# Patient Record
Sex: Female | Born: 1965 | Marital: Married | Smoking: Never smoker
Health system: Southern US, Community
[De-identification: ages and names within clinical notes are randomized; demographics above are authoritative.]

---

## 2012-06-08 ENCOUNTER — Ambulatory Visit: Payer: Self-pay | Admitting: Internal Medicine

## 2019-09-15 ENCOUNTER — Other Ambulatory Visit: Payer: Self-pay

## 2019-09-15 DIAGNOSIS — Z20822 Contact with and (suspected) exposure to covid-19: Secondary | ICD-10-CM

## 2019-09-16 LAB — NOVEL CORONAVIRUS, NAA: SARS-CoV-2, NAA: NOT DETECTED

## 2020-05-03 ENCOUNTER — Ambulatory Visit: Payer: 59 | Admitting: Internal Medicine

## 2020-08-12 ENCOUNTER — Other Ambulatory Visit: Payer: Self-pay | Admitting: *Deleted

## 2020-08-12 DIAGNOSIS — Z1231 Encounter for screening mammogram for malignant neoplasm of breast: Secondary | ICD-10-CM

## 2020-09-04 ENCOUNTER — Other Ambulatory Visit: Payer: Self-pay | Admitting: Internal Medicine

## 2020-09-04 DIAGNOSIS — Z1231 Encounter for screening mammogram for malignant neoplasm of breast: Secondary | ICD-10-CM

## 2020-09-25 ENCOUNTER — Other Ambulatory Visit: Payer: Self-pay

## 2020-09-25 ENCOUNTER — Ambulatory Visit
Admission: RE | Admit: 2020-09-25 | Discharge: 2020-09-25 | Disposition: A | Discharge status: Home / Self Care | Payer: 59 | Source: Ambulatory Visit | Attending: Internal Medicine | Admitting: Internal Medicine

## 2020-09-25 DIAGNOSIS — Z1231 Encounter for screening mammogram for malignant neoplasm of breast: Secondary | ICD-10-CM

## 2020-10-02 ENCOUNTER — Other Ambulatory Visit: Payer: Self-pay | Admitting: Internal Medicine

## 2020-10-02 DIAGNOSIS — R928 Other abnormal and inconclusive findings on diagnostic imaging of breast: Secondary | ICD-10-CM

## 2020-10-15 ENCOUNTER — Other Ambulatory Visit: Payer: Self-pay | Admitting: Internal Medicine

## 2020-10-15 ENCOUNTER — Ambulatory Visit
Admission: RE | Admit: 2020-10-15 | Discharge: 2020-10-15 | Disposition: A | Discharge status: Home / Self Care | Payer: 59 | Source: Ambulatory Visit | Attending: Internal Medicine | Admitting: Internal Medicine

## 2020-10-15 ENCOUNTER — Other Ambulatory Visit: Payer: Self-pay

## 2020-10-15 DIAGNOSIS — R928 Other abnormal and inconclusive findings on diagnostic imaging of breast: Secondary | ICD-10-CM

## 2020-10-25 ENCOUNTER — Other Ambulatory Visit: Payer: 59

## 2020-11-19 ENCOUNTER — Other Ambulatory Visit: Payer: Self-pay | Admitting: Internal Medicine

## 2020-11-19 ENCOUNTER — Ambulatory Visit
Admission: RE | Admit: 2020-11-19 | Discharge: 2020-11-19 | Disposition: A | Discharge status: Home / Self Care | Payer: 59 | Source: Ambulatory Visit | Attending: Internal Medicine | Admitting: Internal Medicine

## 2020-11-19 ENCOUNTER — Other Ambulatory Visit: Payer: Self-pay

## 2020-11-19 DIAGNOSIS — R928 Other abnormal and inconclusive findings on diagnostic imaging of breast: Secondary | ICD-10-CM

## 2021-04-22 ENCOUNTER — Other Ambulatory Visit: Payer: Self-pay | Admitting: *Deleted

## 2021-04-22 MED ORDER — TRIAMCINOLONE ACETONIDE 0.1 % EX CREA: 1.0000 "application " | TOPICAL_CREAM | Freq: Two times a day (BID) | CUTANEOUS | 0 refills | Status: DC

## 2021-04-24 ENCOUNTER — Encounter: Payer: 59 | Admitting: Family Medicine

## 2021-05-30 ENCOUNTER — Other Ambulatory Visit: Payer: Self-pay | Admitting: Internal Medicine

## 2021-05-30 DIAGNOSIS — Z1231 Encounter for screening mammogram for malignant neoplasm of breast: Secondary | ICD-10-CM

## 2021-10-01 ENCOUNTER — Ambulatory Visit: Payer: 59

## 2021-11-12 ENCOUNTER — Encounter: Payer: 59 | Admitting: Internal Medicine

## 2021-11-12 IMAGING — US A
1 series · 9 of 9 positions shown · non-contrast
Comparison: Previous exam(s).

CLINICAL DATA: 54-year-old female recalled from screening mammogram
dated 09/25/2020 for a possible right breast mass.

EXAM:
DIGITAL DIAGNOSTIC RIGHT MAMMOGRAM WITH CAD AND TOMO
ULTRASOUND RIGHT BREAST

[Series 1: a · 0.06mm/px · 9 of 9 slices shown]
[im 1/9]
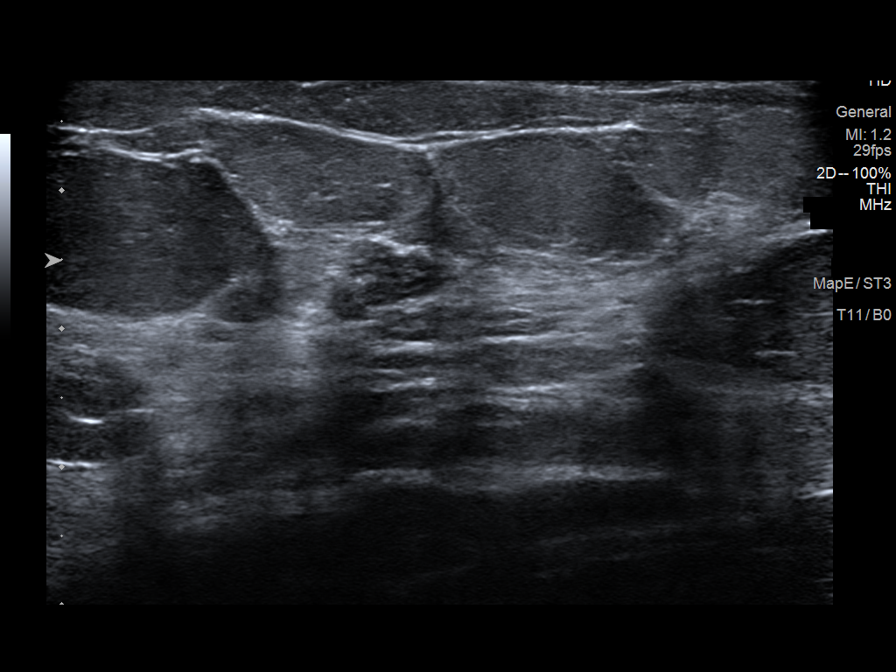
[im 2/9]
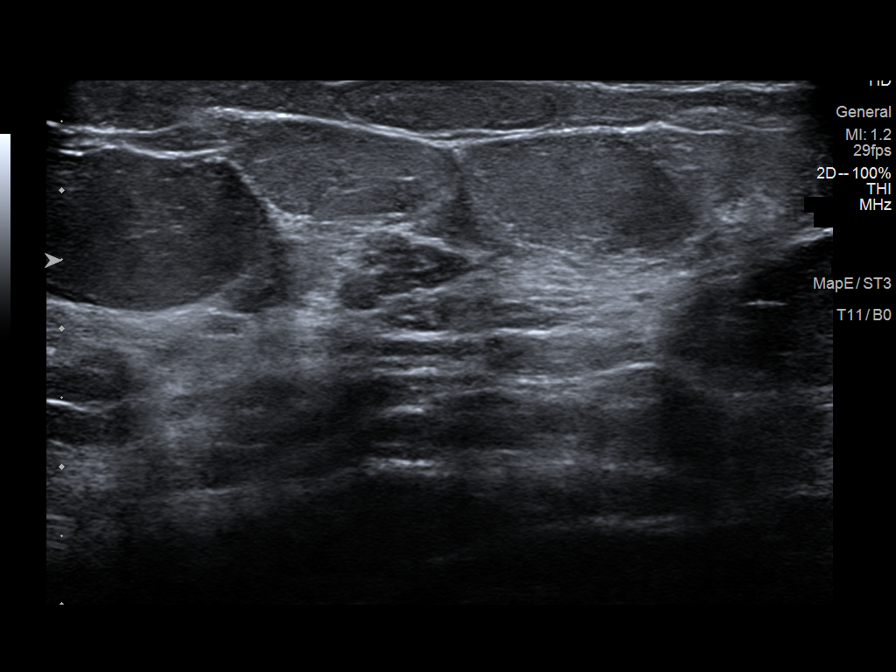
[im 3/9]
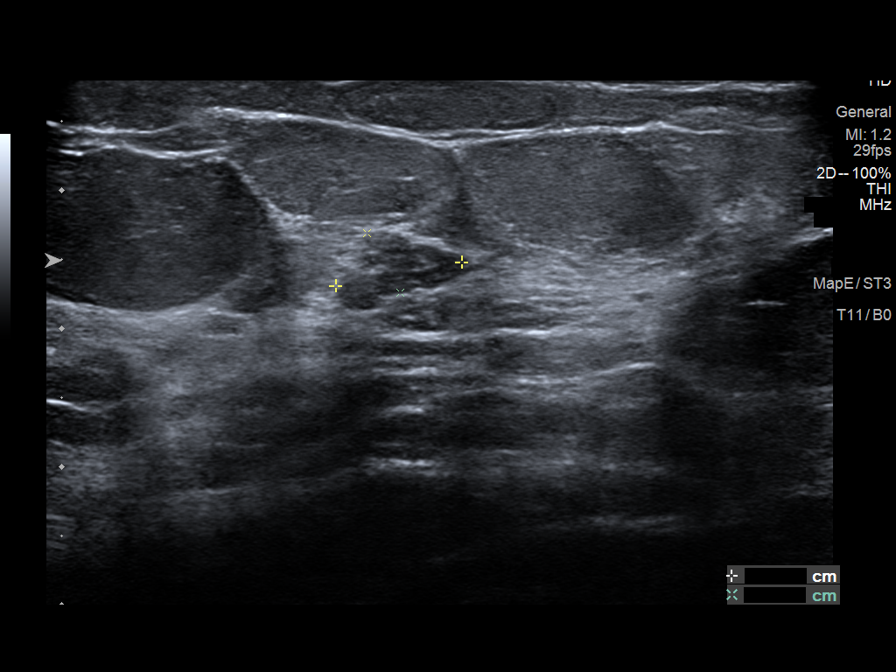
[im 4/9]
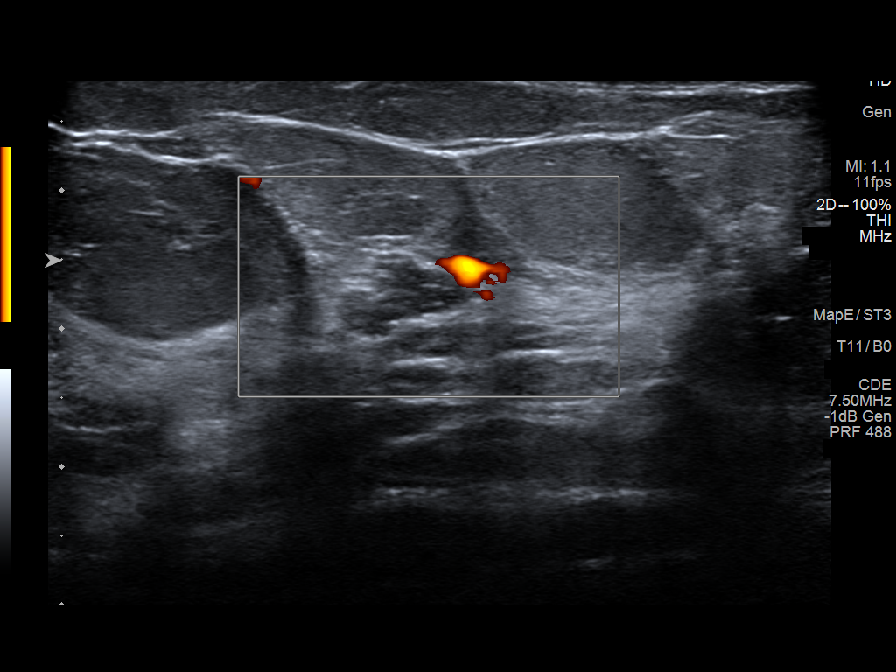
[im 5/9]
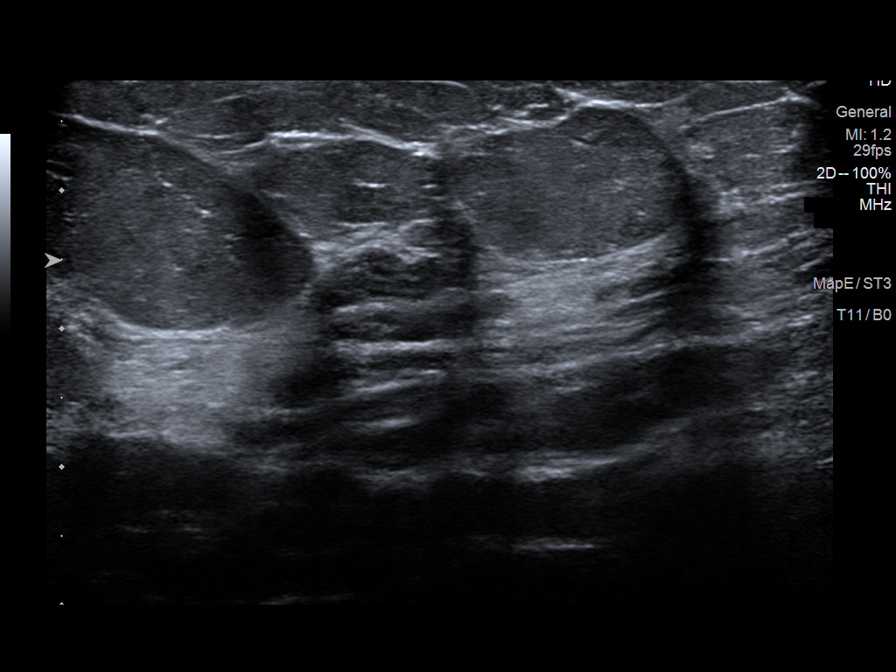
[im 6/9]
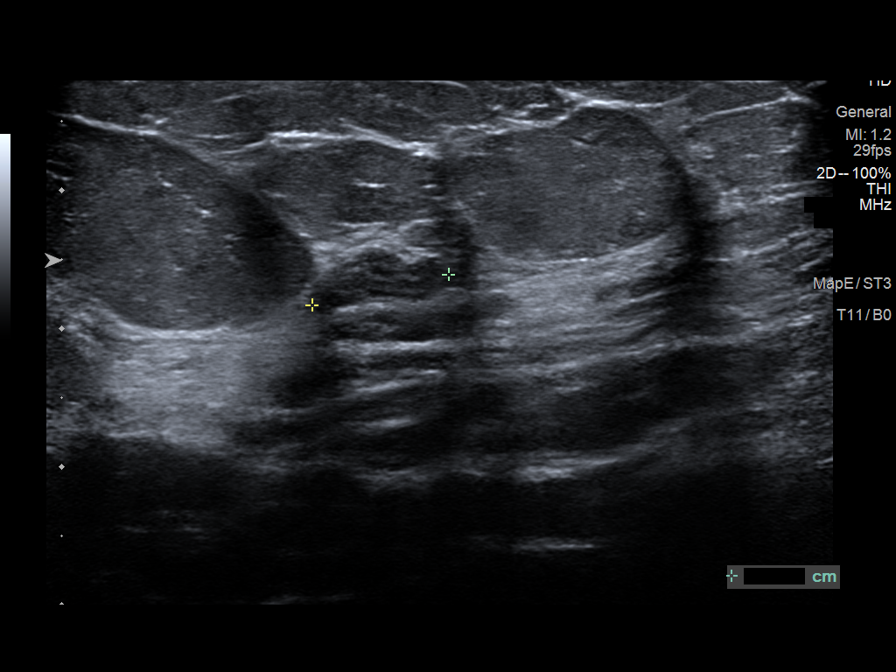
[im 7/9]
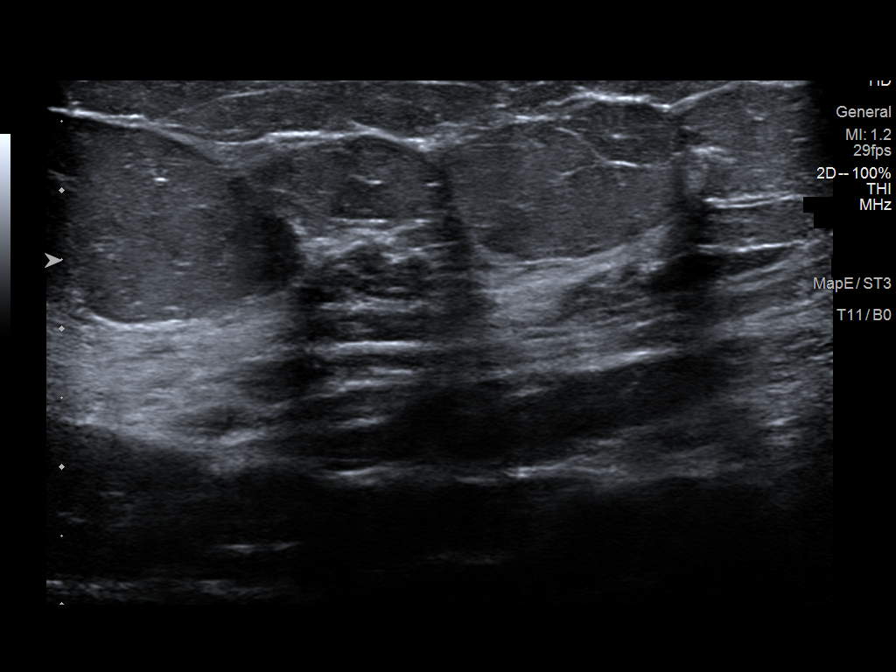
[im 8/9]
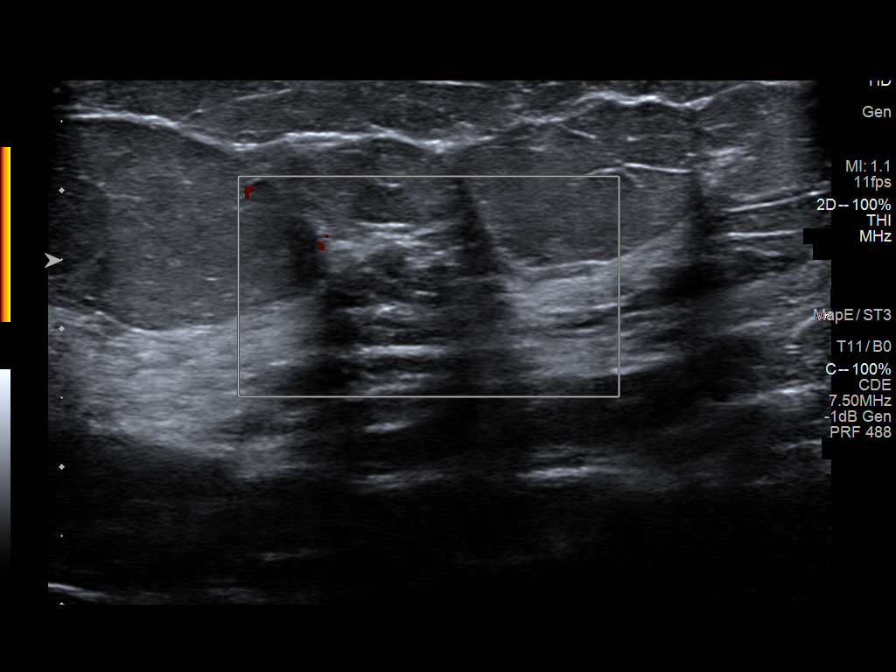
[im 9/9]
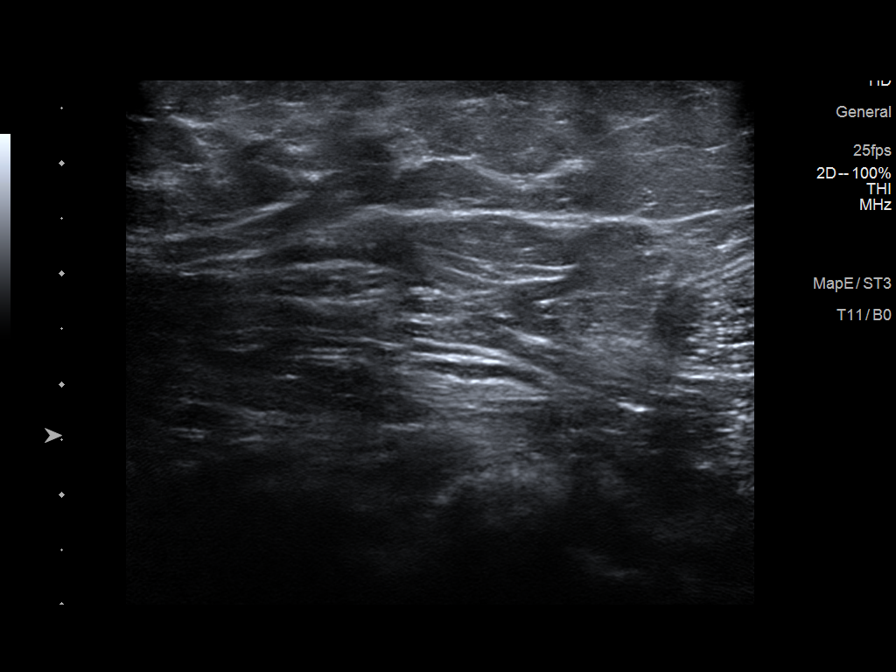

[9 of 9 positions shown; findings below may reference images not displayed]

ACR Breast Density Category b: There are scattered areas of
fibroglandular density.
FINDINGS: Previously described, possible mass in the upper outer right breast
at anterior depth partially effaces on today's additional views. A
partially obscured mass persists, best seen on the ML projection.
Further evaluation

Mammographic images were processed with CAD.

Targeted ultrasound is performed, showing a circumscribed,
hypoechoic mass with angular and irregular margins at the 11 o'clock
position 3 cm from the nipple. It measures 10 x 9 x 5 mm. There is
peripheral vascularity. This likely corresponds with the
mammographic finding.

Evaluation of the right axilla demonstrates no suspicious
lymphadenopathy.
IMPRESSION: 1. Indeterminate right breast mass at the 11 o'clock position.
Recommendation is for ultrasound-guided biopsy.
2. No suspicious right axillary lymphadenopathy.

RECOMMENDATION:
Ultrasound-guided biopsy of the right breast.

I have discussed the findings and recommendations with the patient.
If applicable, a reminder letter will be sent to the patient
regarding the next appointment.

BI-RADS CATEGORY  4: Suspicious.

## 2021-11-17 ENCOUNTER — Encounter: Payer: 59 | Admitting: Internal Medicine

## 2021-12-17 IMAGING — MG MM BREAST LOCALIZATION CLIP
4 series · 4 of 12 positions shown · non-contrast
Comparison: Previous exam(s).

CLINICAL DATA: Evaluate placement of RIBBON biopsy clip and COIL
biopsy clip following ultrasound-guided RIGHT breast biopsies.

EXAM:
DIAGNOSTIC RIGHT MAMMOGRAM POST ULTRASOUND BIOPSY

[R CC synth-2D]
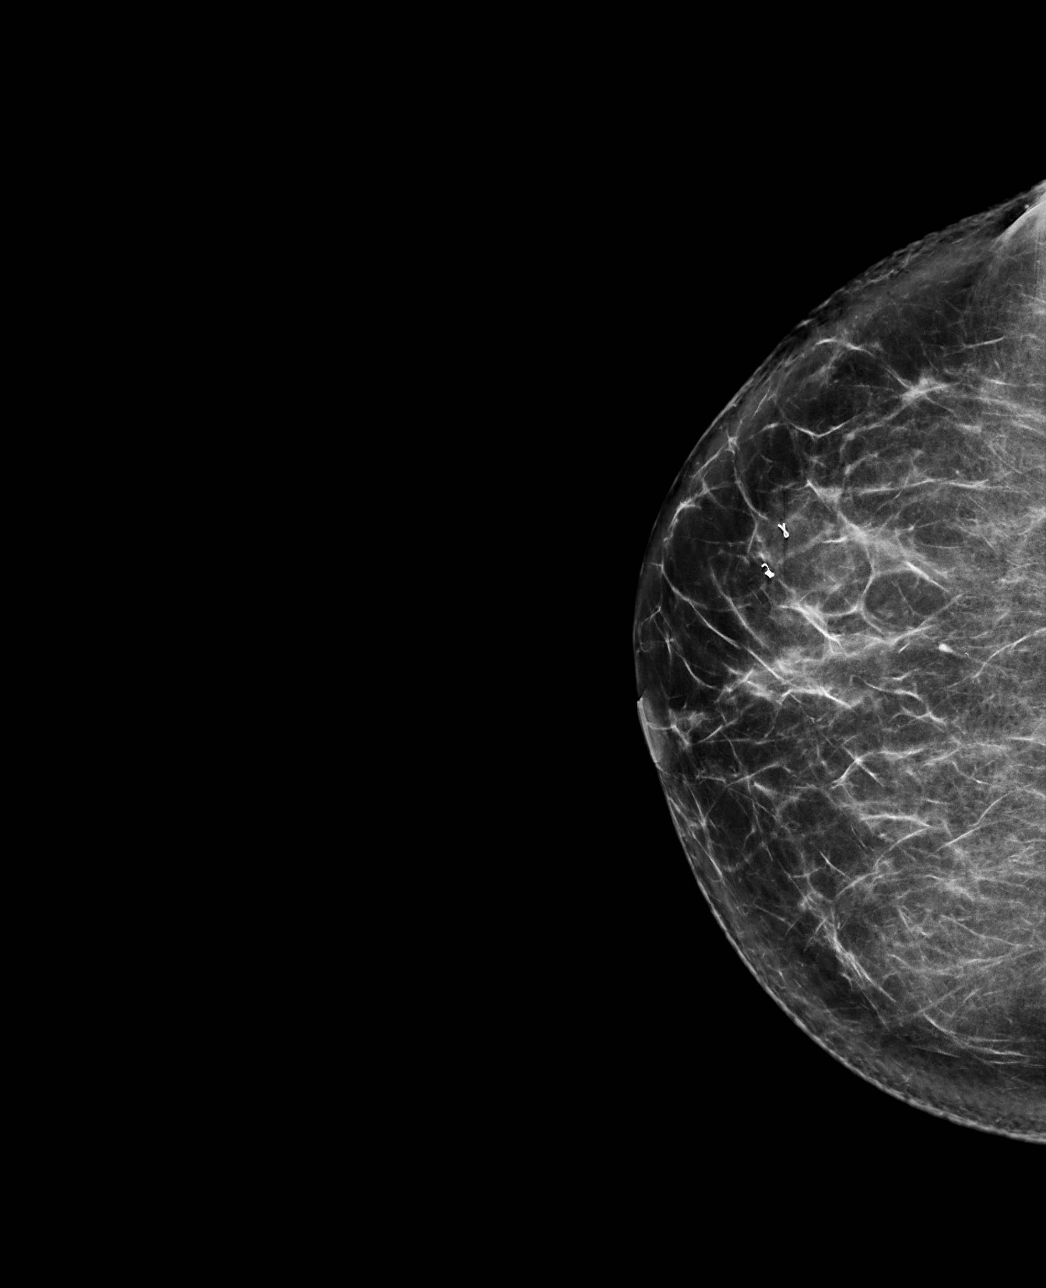

[R ML synth-2D]
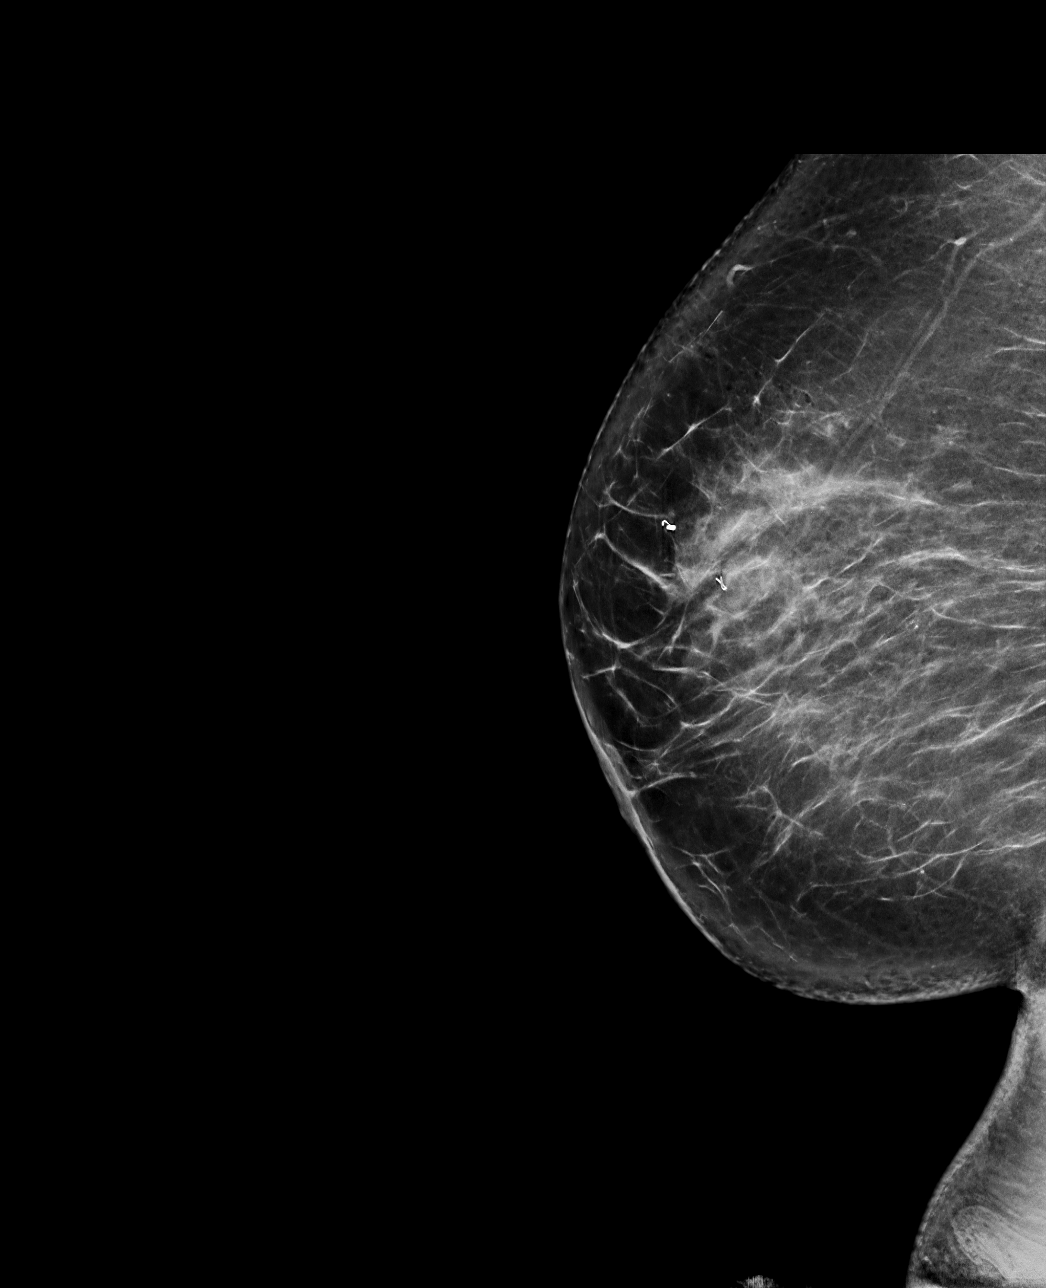

[R CC tomo · tomo slice 44/87.0]
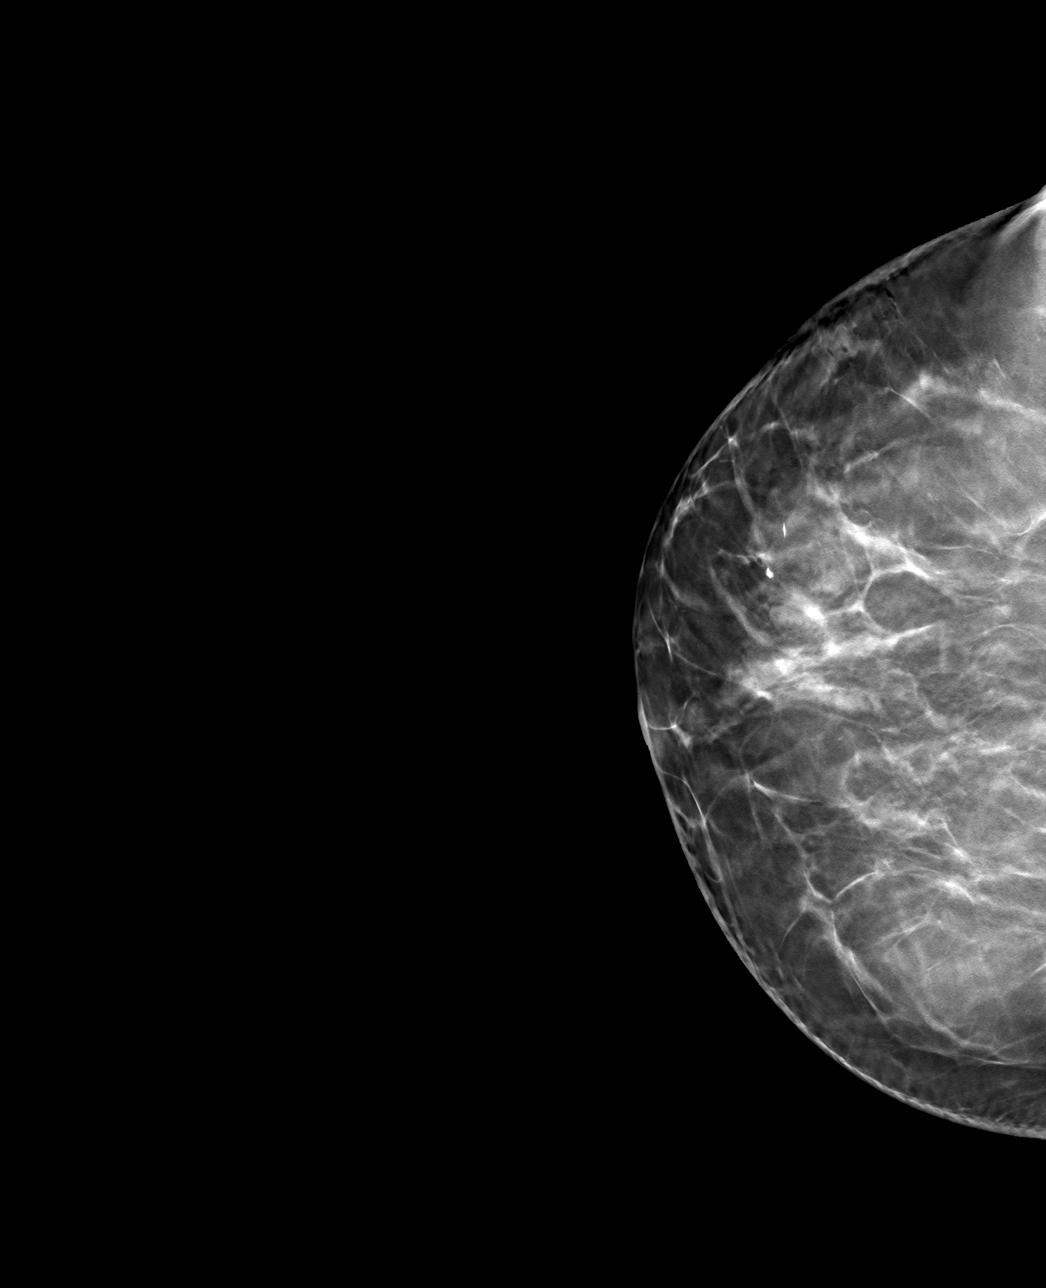

[R ML tomo · tomo slice 47/93.0]
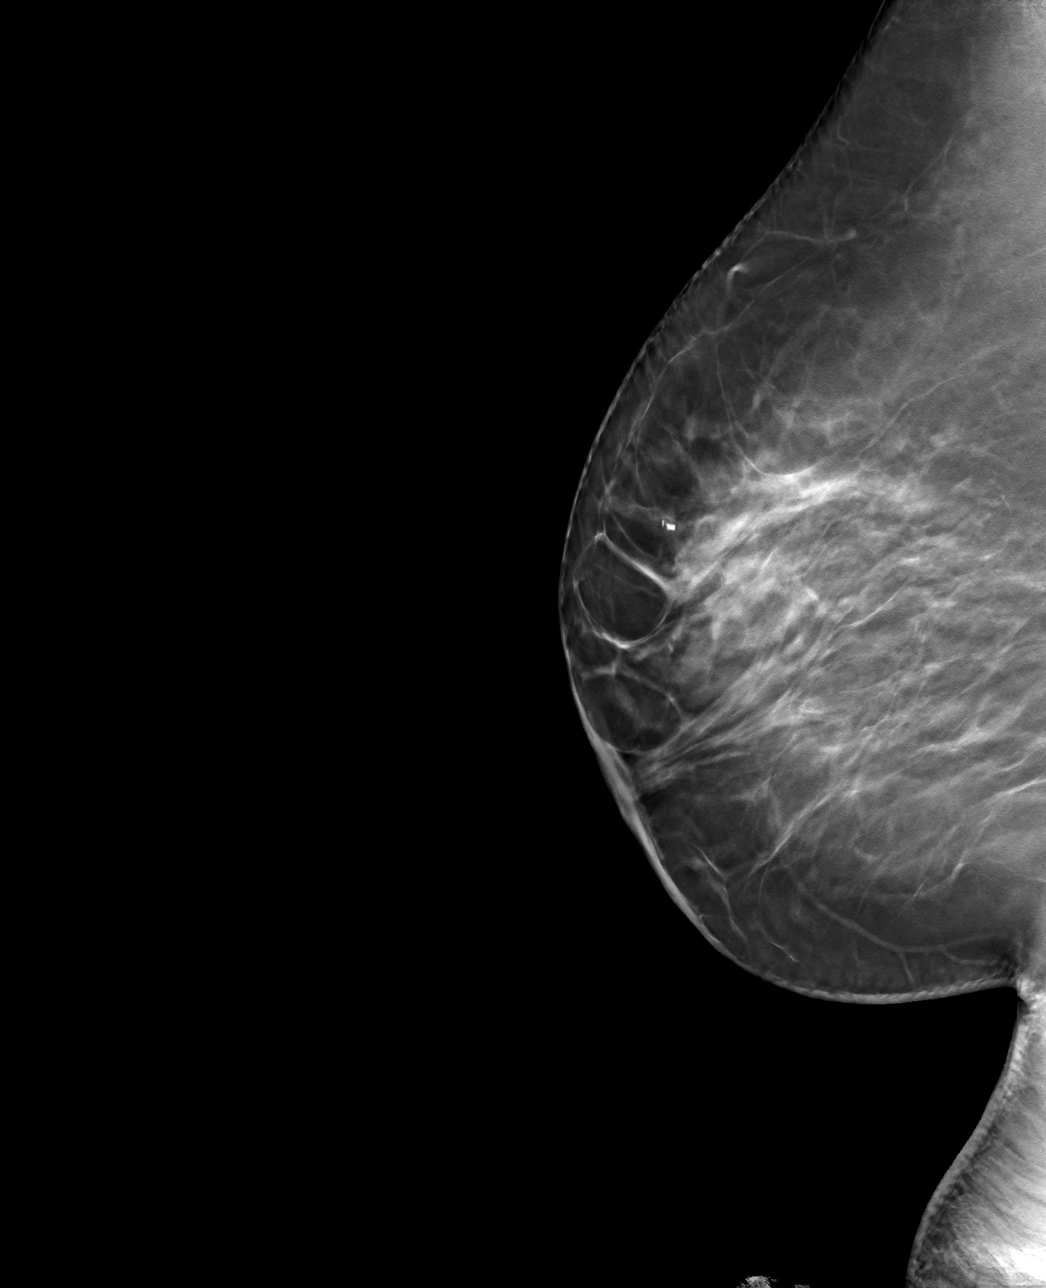

[4 of 12 positions shown; findings below may reference images not displayed]

FINDINGS: Mammographic images were obtained following ultrasound guided biopsy
of a 0.9 cm UPPER-OUTER RIGHT breast mass at the 10 o'clock position
(RIBBON clip) and a 1 cm UPPER-OUTER RIGHT breast mass at the 11
o'clock position (COIL clip).

The RIBBON biopsy marking clip is in expected position at the site
of biopsy of the 10 o'clock position mass.

The COIL biopsy marking clip is in expected position at the site of
biopsy of the 11 o'clock position mass.

The 2 clips are separated by a distance of 1.5 cm.
IMPRESSION: Appropriate positioning of the RIBBON shaped biopsy marking clip at
the site of biopsy in the UPPER OUTER RIGHT breast/10 o'clock
position mass.

Appropriate positioning of the COIL shaped biopsy marking clip at
the site of biopsy in the UPPER-OUTER RIGHT breast/11 o'clock
position mass.

Final Assessment: Post Procedure Mammograms for Marker Placement

## 2022-01-29 ENCOUNTER — Other Ambulatory Visit: Payer: Self-pay

## 2022-01-29 DIAGNOSIS — Z0000 Encounter for general adult medical examination without abnormal findings: Secondary | ICD-10-CM

## 2022-05-05 ENCOUNTER — Ambulatory Visit (INDEPENDENT_AMBULATORY_CARE_PROVIDER_SITE_OTHER): Payer: 59 | Admitting: Internal Medicine

## 2022-05-05 ENCOUNTER — Encounter: Payer: Self-pay | Admitting: Internal Medicine

## 2022-05-05 VITALS — BP 112/71 | HR 50 | Ht 63.0 in | Wt 175.0 lb

## 2022-05-05 DIAGNOSIS — M1990 Unspecified osteoarthritis, unspecified site: Secondary | ICD-10-CM | POA: Insufficient documentation

## 2022-05-05 DIAGNOSIS — E669 Obesity, unspecified: Secondary | ICD-10-CM

## 2022-05-05 DIAGNOSIS — Z1211 Encounter for screening for malignant neoplasm of colon: Secondary | ICD-10-CM

## 2022-05-05 DIAGNOSIS — Z0000 Encounter for general adult medical examination without abnormal findings: Secondary | ICD-10-CM | POA: Insufficient documentation

## 2022-05-05 DIAGNOSIS — L209 Atopic dermatitis, unspecified: Secondary | ICD-10-CM | POA: Diagnosis not present

## 2022-05-05 LAB — POCT URINALYSIS DIPSTICK
Bilirubin, UA: NEGATIVE
Blood, UA: NEGATIVE
Glucose, UA: NEGATIVE
Ketones, UA: NEGATIVE
Leukocytes, UA: NEGATIVE
Nitrite, UA: NEGATIVE
Protein, UA: NEGATIVE
Spec Grav, UA: 1.025 (ref 1.010–1.025)
Urobilinogen, UA: 0.2 E.U./dL
pH, UA: 6 (ref 5.0–8.0)

## 2022-05-05 NOTE — Assessment & Plan Note (Signed)

## 2022-05-05 NOTE — Assessment & Plan Note (Signed)
Suggest colonoscopy 

## 2022-05-05 NOTE — Progress Notes (Signed)
Established Patient Office Visit  Subjective:  Patient ID: Jillian Ray, female    DOB: [birth year]  Age: 56 y.o. MRN: [id number]  CC:  Chief Complaint  Patient presents with   Follow-up    Arthritis Presents for initial visit. The disease course has been worsening. She complains of pain and stiffness. Her pain is at a severity of 2/10. Pertinent negatives include no diarrhea, dry eyes, dry mouth, dysuria, fatigue, fever, pain at night, pain while resting, rash, Raynaud's syndrome, uveitis or weight loss. Her past medical history is significant for osteoarthritis. There is no history of chronic back pain, psoriasis or rheumatoid arthritis.  Past treatments include nothing. Factors aggravating her arthritis include climbing stairs.   [facility] presents for knee arthiritis  History reviewed. No pertinent past medical history.  History reviewed. No pertinent surgical history.  Family History  Problem Relation Age of Onset   Breast cancer Mother        in her 25s    Social History   Socioeconomic History   Marital status: Married    Spouse name: Not on file   Number of children: Not on file   Years of education: Not on file   Highest education level: Not on file  Occupational History   Not on file  Tobacco Use   Smoking status: Never   Smokeless tobacco: Never  Substance and Sexual Activity   Alcohol use: Never   Drug use: Never   Sexual activity: Yes  Other Topics Concern   Not on file  Social History Narrative   Not on file   Social Determinants of Health   Financial Resource Strain: Not on file  Food Insecurity: Not on file  Transportation Needs: Not on file  Physical Activity: Not on file  Stress: Not on file  Social Connections: Not on file  Intimate Partner Violence: Not on file     Current Outpatient Medications:    triamcinolone cream (KENALOG) 0.1 %, Apply 1 application topically 2 (two) times daily., Disp: 30 g, Rfl: 0   No Known  Allergies  ROS Review of Systems  Constitutional: Negative.  Negative for fatigue, fever and weight loss.  HENT: Negative.    Eyes: Negative.   Respiratory: Negative.    Cardiovascular: Negative.   Gastrointestinal: Negative.  Negative for diarrhea.  Endocrine: Negative.   Genitourinary: Negative.  Negative for dysuria.  Musculoskeletal:  Positive for arthritis and stiffness.  Skin: Negative.  Negative for rash.  Allergic/Immunologic: Negative.   Neurological: Negative.   Hematological: Negative.   Psychiatric/Behavioral: Negative.    All other systems reviewed and are negative.    Objective:    Physical Exam Vitals reviewed.  Constitutional:      Appearance: Normal appearance.  HENT:     Mouth/Throat:     Mouth: Mucous membranes are moist.  Eyes:     Pupils: Pupils are equal, round, and reactive to light.  Neck:     Vascular: No carotid bruit.  Cardiovascular:     Rate and Rhythm: Normal rate and regular rhythm.     Pulses: Normal pulses.     Heart sounds: Normal heart sounds.  Pulmonary:     Effort: Pulmonary effort is normal.     Breath sounds: Normal breath sounds.  Abdominal:     General: Bowel sounds are normal.     Palpations: Abdomen is soft. There is no hepatomegaly, splenomegaly or mass.     Tenderness: There is no abdominal tenderness.  Hernia: No hernia is present.  Musculoskeletal:        General: No tenderness.     Cervical back: Neck supple.     Right lower leg: No edema.     Left lower leg: No edema.  Skin:    Findings: No rash.  Neurological:     Mental Status: She is alert and oriented to person, place, and time.     Motor: No weakness.  Psychiatric:        Mood and Affect: Mood and affect normal.        Behavior: Behavior normal.    BP 112/71   Pulse (!) 50   Ht 5' 3"  (1.6 m)   Wt 175 lb (79.4 kg)   BMI 31.00 kg/m  Wt Readings from Last 3 Encounters:  05/05/22 175 lb (79.4 kg)     Health Maintenance Due  Topic Date Due    COVID-19 Vaccine (1) Never done   HIV Screening  Never done   Hepatitis C Screening  Never done   TETANUS/TDAP  Never done   PAP SMEAR-Modifier  Never done   COLONOSCOPY (Pts 45-70yr Insurance coverage will need to be confirmed)  Never done   Zoster Vaccines- Shingrix (1 of 2) Never done    There are no preventive care reminders to display for this patient.  No results found for: TSH No results found for: WBC, HGB, HCT, MCV, PLT No results found for: NA, K, CHLORIDE, CO2, GLUCOSE, BUN, CREATININE, BILITOT, ALKPHOS, AST, ALT, PROT, ALBUMIN, CALCIUM, ANIONGAP, EGFR, GFR No results found for: CHOL No results found for: HDL No results found for: LDLCALC No results found for: TRIG No results found for: CHOLHDL No results found for: HGBA1C    Assessment & Plan:   Problem List Items Addressed This Visit       Musculoskeletal and Integument   Atopic dermatitis of both hands    Advised moisturizing cream       Arthritis    Patient was advised patient was advised to lose weight         Other   Obesity (BMI 30.0-34.9)    - I encouraged the patient to lose weight.  - I educated them on making healthy dietary choices including eating more fruits and vegetables and less fried foods. - I encouraged the patient to exercise more, and educated on the benefits of exercise including weight loss, diabetes prevention, and hypertension prevention.   Dietary counseling with a registered dietician  Referral to a weight management support group (e.g. Weight Watchers, Overeaters Anonymous)  If your BMI is greater than 29 or you have gained more than 15 pounds you should work on weight loss.  Attend a healthy cooking class        Encounter for preventive care    Suggest colonoscopy       Other Visit Diagnoses     Preventative health care    -  Primary   Relevant Orders   CBC with Differential/Platelet   COMPLETE METABOLIC PANEL WITH GFR   Lipid panel   TSH   POCT urinalysis  dipstick (Completed)       No orders of the defined types were placed in this encounter. Lab testing and colonoscopy was ordered  Follow-up: No follow-ups on file.    JCletis Athens MD

## 2022-05-05 NOTE — Assessment & Plan Note (Signed)
Advised moisturizing cream

## 2022-05-05 NOTE — Addendum Note (Signed)
Addended by: Jobie Quaker on: 05/05/2022 11:43 AM   Modules accepted: Orders

## 2022-05-05 NOTE — Assessment & Plan Note (Signed)
Patient was advised patient was advised to lose weight

## 2022-05-06 ENCOUNTER — Telehealth: Payer: Self-pay

## 2022-05-06 LAB — CBC WITH DIFFERENTIAL/PLATELET
Absolute Monocytes: 433 cells/uL (ref 200–950)
Basophils Absolute: 29 cells/uL (ref 0–200)
Basophils Relative: 0.5 %
Eosinophils Absolute: 154 cells/uL (ref 15–500)
Eosinophils Relative: 2.7 %
HCT: 42.7 % (ref 35.0–45.0)
Hemoglobin: 14.3 g/dL (ref 11.7–15.5)
Lymphs Abs: 2559 cells/uL (ref 850–3900)
MCH: 30 pg (ref 27.0–33.0)
MCHC: 33.5 g/dL (ref 32.0–36.0)
MCV: 89.5 fL (ref 80.0–100.0)
MPV: 11.8 fL (ref 7.5–12.5)
Monocytes Relative: 7.6 %
Neutro Abs: 2525 cells/uL (ref 1500–7800)
Neutrophils Relative %: 44.3 %
Platelets: 200 10*3/uL (ref 140–400)
RBC: 4.77 10*6/uL (ref 3.80–5.10)
RDW: 12.3 % (ref 11.0–15.0)
Total Lymphocyte: 44.9 %
WBC: 5.7 10*3/uL (ref 3.8–10.8)

## 2022-05-06 LAB — LIPID PANEL
Cholesterol: 198 mg/dL (ref <200)
HDL: 75 mg/dL (ref >=50)
LDL Cholesterol (Calc): 105 mg/dL (calc) — ABNORMAL HIGH
Non-HDL Cholesterol (Calc): 123 mg/dL (calc) (ref <130)
Total CHOL/HDL Ratio: 2.6 (calc) (ref <5.0)
Triglycerides: 85 mg/dL (ref <150)

## 2022-05-06 LAB — COMPLETE METABOLIC PANEL WITH GFR
AG Ratio: 1.6 (calc) (ref 1.0–2.5)
ALT: 25 U/L (ref 6–29)
AST: 21 U/L (ref 10–35)
Albumin: 4.7 g/dL (ref 3.6–5.1)
Alkaline phosphatase (APISO): 117 U/L (ref 37–153)
BUN: 16 mg/dL (ref 7–25)
CO2: 21 mmol/L (ref 20–32)
Calcium: 9.4 mg/dL (ref 8.6–10.4)
Chloride: 109 mmol/L (ref 98–110)
Creat: 0.71 mg/dL (ref 0.50–1.03)
Globulin: 2.9 g/dL (calc) (ref 1.9–3.7)
Glucose, Bld: 101 mg/dL — ABNORMAL HIGH (ref 65–99)
Potassium: 4.3 mmol/L (ref 3.5–5.3)
Sodium: 143 mmol/L (ref 135–146)
Total Bilirubin: 0.5 mg/dL (ref 0.2–1.2)
Total Protein: 7.6 g/dL (ref 6.1–8.1)
eGFR: 101 mL/min/{1.73_m2} (ref >=60)

## 2022-05-06 LAB — TSH: TSH: 2.23 mIU/L

## 2022-05-06 NOTE — Telephone Encounter (Signed)
CALLED PATIENT NO ANSWER LEFT VOICEMAIL FOR A CALL BACK °Letter sent °

## 2022-11-12 DIAGNOSIS — R102 Pelvic and perineal pain: Secondary | ICD-10-CM | POA: Diagnosis not present

## 2022-11-12 DIAGNOSIS — Z01419 Encounter for gynecological examination (general) (routine) without abnormal findings: Secondary | ICD-10-CM | POA: Diagnosis not present

## 2022-11-17 ENCOUNTER — Encounter: Payer: Self-pay | Admitting: Internal Medicine

## 2022-11-17 ENCOUNTER — Ambulatory Visit (INDEPENDENT_AMBULATORY_CARE_PROVIDER_SITE_OTHER): Payer: 59 | Admitting: Internal Medicine

## 2022-11-17 VITALS — BP 116/65 | HR 56 | Temp 97.9°F | Ht 60.5 in | Wt 174.3 lb

## 2022-11-17 DIAGNOSIS — Z0000 Encounter for general adult medical examination without abnormal findings: Secondary | ICD-10-CM | POA: Diagnosis not present

## 2022-11-17 DIAGNOSIS — E669 Obesity, unspecified: Secondary | ICD-10-CM

## 2022-11-17 DIAGNOSIS — E785 Hyperlipidemia, unspecified: Secondary | ICD-10-CM | POA: Diagnosis not present

## 2022-11-17 DIAGNOSIS — Z1329 Encounter for screening for other suspected endocrine disorder: Secondary | ICD-10-CM

## 2022-11-17 DIAGNOSIS — L209 Atopic dermatitis, unspecified: Secondary | ICD-10-CM

## 2022-11-17 DIAGNOSIS — M79671 Pain in right foot: Secondary | ICD-10-CM | POA: Diagnosis not present

## 2022-11-17 MED ORDER — TRIAMCINOLONE ACETONIDE 40 MG/ML IJ SUSP
40.0000 mg | Freq: Once | INTRAMUSCULAR | Status: AC
  Administered 2022-11-17: 40 mg via INTRA_ARTICULAR

## 2022-11-17 MED ORDER — TRIAMCINOLONE ACETONIDE 40 MG/ML IJ SUSP: 40.0000 mg | Freq: Once | INTRAMUSCULAR | Status: DC

## 2022-11-18 LAB — CBC WITH DIFFERENTIAL/PLATELET
Absolute Monocytes: 405 cells/uL (ref 200–950)
Basophils Absolute: 29 cells/uL (ref 0–200)
Basophils Relative: 0.5 %
Eosinophils Absolute: 143 cells/uL (ref 15–500)
Eosinophils Relative: 2.5 %
HCT: 39.3 % (ref 35.0–45.0)
Hemoglobin: 13.4 g/dL (ref 11.7–15.5)
Lymphs Abs: 2525 cells/uL (ref 850–3900)
MCH: 30.1 pg (ref 27.0–33.0)
MCHC: 34.1 g/dL (ref 32.0–36.0)
MCV: 88.3 fL (ref 80.0–100.0)
MPV: 11.6 fL (ref 7.5–12.5)
Monocytes Relative: 7.1 %
Neutro Abs: 2599 cells/uL (ref 1500–7800)
Neutrophils Relative %: 45.6 %
Platelets: 195 10*3/uL (ref 140–400)
RBC: 4.45 10*6/uL (ref 3.80–5.10)
RDW: 12 % (ref 11.0–15.0)
Total Lymphocyte: 44.3 %
WBC: 5.7 10*3/uL (ref 3.8–10.8)

## 2022-11-18 LAB — POCT URINALYSIS DIPSTICK
Bilirubin, UA: NEGATIVE
Blood, UA: NEGATIVE
Glucose, UA: NEGATIVE
Ketones, UA: NEGATIVE
Leukocytes, UA: NEGATIVE
Nitrite, UA: NEGATIVE
Protein, UA: NEGATIVE
Spec Grav, UA: 1.02 (ref 1.010–1.025)
Urobilinogen, UA: 0.2 E.U./dL
pH, UA: 5.5 (ref 5.0–8.0)

## 2022-11-18 LAB — LIPID PANEL
Cholesterol: 209 mg/dL — ABNORMAL HIGH (ref <200)
HDL: 69 mg/dL (ref >=50)
LDL Cholesterol (Calc): 120 mg/dL (calc) — ABNORMAL HIGH
Non-HDL Cholesterol (Calc): 140 mg/dL (calc) — ABNORMAL HIGH (ref <130)
Total CHOL/HDL Ratio: 3 (calc) (ref <5.0)
Triglycerides: 102 mg/dL (ref <150)

## 2022-11-18 LAB — COMPLETE METABOLIC PANEL WITH GFR
AG Ratio: 1.5 (calc) (ref 1.0–2.5)
ALT: 20 U/L (ref 6–29)
AST: 16 U/L (ref 10–35)
Albumin: 4.4 g/dL (ref 3.6–5.1)
Alkaline phosphatase (APISO): 110 U/L (ref 37–153)
BUN: 15 mg/dL (ref 7–25)
CO2: 27 mmol/L (ref 20–32)
Calcium: 9.2 mg/dL (ref 8.6–10.4)
Chloride: 105 mmol/L (ref 98–110)
Creat: 0.62 mg/dL (ref 0.50–1.03)
Globulin: 3 g/dL (calc) (ref 1.9–3.7)
Glucose, Bld: 97 mg/dL (ref 65–99)
Potassium: 4.3 mmol/L (ref 3.5–5.3)
Sodium: 140 mmol/L (ref 135–146)
Total Bilirubin: 0.7 mg/dL (ref 0.2–1.2)
Total Protein: 7.4 g/dL (ref 6.1–8.1)
eGFR: 106 mL/min/{1.73_m2} (ref >=60)

## 2022-11-18 LAB — TSH: TSH: 1.41 mIU/L

## 2022-11-23 ENCOUNTER — Encounter: Payer: Self-pay | Admitting: Internal Medicine

## 2022-11-23 DIAGNOSIS — M79671 Pain in right foot: Secondary | ICD-10-CM | POA: Insufficient documentation

## 2022-11-23 NOTE — Assessment & Plan Note (Signed)
Annual vaccination is suggested

## 2022-11-23 NOTE — Progress Notes (Signed)
Established Patient Office Visit  Subjective:  Patient ID: Jillian Ray, female    DOB: 1966/03/07  Age: 56 y.o. MRN: [id number]  CC:  Chief Complaint  Patient presents with   Annual Exam    HPI  [facility] presents for pain in foot  History reviewed. No pertinent past medical history.  History reviewed. No pertinent surgical history.  Family History  Problem Relation Age of Onset   Breast cancer Mother        in her 78s    Social History   Socioeconomic History   Marital status: Married    Spouse name: Not on file   Number of children: Not on file   Years of education: Not on file   Highest education level: Not on file  Occupational History   Not on file  Tobacco Use   Smoking status: Never   Smokeless tobacco: Never  Substance and Sexual Activity   Alcohol use: Never   Drug use: Never   Sexual activity: Yes  Other Topics Concern   Not on file  Social History Narrative   Not on file   Social Determinants of Health   Financial Resource Strain: Not on file  Food Insecurity: Not on file  Transportation Needs: Not on file  Physical Activity: Not on file  Stress: Not on file  Social Connections: Not on file  Intimate Partner Violence: Not on file    No current outpatient medications on file.   Allergies  Allergen Reactions   Vitamin D Analogs     Muscle pain     ROS Review of Systems  Constitutional: Negative.   HENT: Negative.    Eyes: Negative.   Respiratory: Negative.    Cardiovascular: Negative.   Gastrointestinal: Negative.   Endocrine: Negative.   Genitourinary: Negative.   Musculoskeletal: Negative.   Skin: Negative.   Allergic/Immunologic: Negative.   Neurological: Negative.   Hematological: Negative.   Psychiatric/Behavioral: Negative.    All other systems reviewed and are negative.     Objective:    Physical Exam Vitals reviewed.  Constitutional:      Appearance: Normal appearance.  HENT:     Mouth/Throat:      Mouth: Mucous membranes are moist.  Eyes:     Pupils: Pupils are equal, round, and reactive to light.  Neck:     Vascular: No carotid bruit.  Cardiovascular:     Rate and Rhythm: Normal rate and regular rhythm.     Pulses: Normal pulses.     Heart sounds: Normal heart sounds.  Pulmonary:     Effort: Pulmonary effort is normal.     Breath sounds: Normal breath sounds.  Abdominal:     General: Bowel sounds are normal.     Palpations: Abdomen is soft. There is no hepatomegaly, splenomegaly or mass.     Tenderness: There is no abdominal tenderness.     Hernia: No hernia is present.  Musculoskeletal:        General: No tenderness.     Cervical back: Neck supple.     Right lower leg: No edema.     Left lower leg: No edema.  Skin:    Findings: No rash.  Neurological:     Mental Status: She is alert and oriented to person, place, and time.     Motor: No weakness.  Psychiatric:        Mood and Affect: Mood and affect normal.        Behavior: Behavior normal.  BP 116/65   Pulse (!) 56   Temp 97.9 F (36.6 C) (Temporal)   Ht 5' 0.5" (1.537 m)   Wt 174 lb 4.8 oz (79.1 kg)   SpO2 94%   BMI 33.48 kg/m  Wt Readings from Last 3 Encounters:  11/17/22 174 lb 4.8 oz (79.1 kg)  05/05/22 175 lb (79.4 kg)     Health Maintenance Due  Topic Date Due   HIV Screening  Never done   Hepatitis C Screening  Never done   DTaP/Tdap/Td (1 - Tdap) Never done   COLONOSCOPY (Pts 45-42yr Insurance coverage will need to be confirmed)  Never done    There are no preventive care reminders to display for this patient.  Lab Results  Component Value Date   TSH 1.41 11/17/2022   Lab Results  Component Value Date   WBC 5.7 11/17/2022   HGB 13.4 11/17/2022   HCT 39.3 11/17/2022   MCV 88.3 11/17/2022   PLT 195 11/17/2022   Lab Results  Component Value Date   NA 140 11/17/2022   K 4.3 11/17/2022   CO2 27 11/17/2022   GLUCOSE 97 11/17/2022   BUN 15 11/17/2022   CREATININE 0.62  11/17/2022   BILITOT 0.7 11/17/2022   AST 16 11/17/2022   ALT 20 11/17/2022   PROT 7.4 11/17/2022   CALCIUM 9.2 11/17/2022   EGFR 106 11/17/2022   Lab Results  Component Value Date   CHOL 209 (H) 11/17/2022   Lab Results  Component Value Date   HDL 69 11/17/2022   Lab Results  Component Value Date   LDLCALC 120 (H) 11/17/2022   Lab Results  Component Value Date   TRIG 102 11/17/2022   Lab Results  Component Value Date   CHOLHDL 3.0 11/17/2022   No results found for: "HGBA1C"    Assessment & Plan:   Problem List Items Addressed This Visit       Musculoskeletal and Integument   Atopic dermatitis of both hands    Under control        Other   Obesity (BMI 30.0-34.9)    - I encouraged the patient to lose weight.  - I educated them on making healthy dietary choices including eating more fruits and vegetables and less fried foods. - I encouraged the patient to exercise more, and educated on the benefits of exercise including weight loss, diabetes prevention, and hypertension prevention.   Dietary counseling with a registered dietician  Referral to a weight management support group (e.g. Weight Watchers, Overeaters Anonymous)  If your BMI is greater than 29 or you have gained more than 15 pounds you should work on weight loss.  Attend a healthy cooking class      Relevant Orders   Lipid panel (Completed)   Preventative health care - Primary    Annual vaccination is suggested      Relevant Orders   COMPLETE METABOLIC PANEL WITH GFR (Completed)   CBC with Differential/Platelet (Completed)   Lipid panel (Completed)   TSH (Completed)   POCT urinalysis dipstick (Completed)   Pain of right heel    Under aseptic precautions and with painting of Betadine in the right heel it was injected with 1 cc of lidocaine 1% followed by Kenalog 40 mg.  Patient related the procedure well and she was advised to wait in the waiting area for 5 minutes before going home       Other Visit Diagnoses     Screening for thyroid disorder  Relevant Orders   TSH (Completed)       Meds ordered this encounter  Medications   DISCONTD: triamcinolone acetonide (KENALOG-40) injection 40 mg   triamcinolone acetonide (KENALOG-40) injection 40 mg    Follow-up: No follow-ups on file.    Cletis Athens, MD

## 2022-11-23 NOTE — Assessment & Plan Note (Signed)
Under aseptic precautions and with painting of Betadine in the right heel it was injected with 1 cc of lidocaine 1% followed by Kenalog 40 mg.  Patient related the procedure well and she was advised to wait in the waiting area for 5 minutes before going home

## 2022-11-23 NOTE — Assessment & Plan Note (Signed)
Under control 

## 2022-11-23 NOTE — Assessment & Plan Note (Signed)

## 2023-08-27 DIAGNOSIS — Z1231 Encounter for screening mammogram for malignant neoplasm of breast: Secondary | ICD-10-CM | POA: Diagnosis not present

## 2023-10-09 DIAGNOSIS — R300 Dysuria: Secondary | ICD-10-CM | POA: Diagnosis not present

## 2023-10-09 DIAGNOSIS — M25561 Pain in right knee: Secondary | ICD-10-CM | POA: Diagnosis not present

## 2023-10-13 DIAGNOSIS — M1711 Unilateral primary osteoarthritis, right knee: Secondary | ICD-10-CM | POA: Diagnosis not present

## 2023-11-02 DIAGNOSIS — N3001 Acute cystitis with hematuria: Secondary | ICD-10-CM | POA: Diagnosis not present

## 2023-11-02 DIAGNOSIS — R319 Hematuria, unspecified: Secondary | ICD-10-CM | POA: Diagnosis not present

## 2023-11-19 DIAGNOSIS — Z01419 Encounter for gynecological examination (general) (routine) without abnormal findings: Secondary | ICD-10-CM | POA: Diagnosis not present

## 2023-11-19 DIAGNOSIS — R102 Pelvic and perineal pain: Secondary | ICD-10-CM | POA: Diagnosis not present

## 2023-11-19 DIAGNOSIS — Z1151 Encounter for screening for human papillomavirus (HPV): Secondary | ICD-10-CM | POA: Diagnosis not present
